# Patient Record
Sex: Female | Born: 2000 | Marital: Single | Smoking: Never smoker
Health system: Southern US, Community
[De-identification: ages and names within clinical notes are randomized; demographics above are authoritative.]

---
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
## 2020-09-17 ENCOUNTER — Other Ambulatory Visit: Payer: Self-pay | Admitting: Family Medicine

## 2020-09-17 DIAGNOSIS — S060X0A Concussion without loss of consciousness, initial encounter: Secondary | ICD-10-CM

## 2020-09-18 ENCOUNTER — Ambulatory Visit
Admission: RE | Admit: 2020-09-18 | Discharge: 2020-09-18 | Disposition: A | Discharge status: Home / Self Care | Payer: Self-pay | Source: Ambulatory Visit | Attending: Family Medicine | Admitting: Family Medicine

## 2020-09-18 DIAGNOSIS — S060X0A Concussion without loss of consciousness, initial encounter: Secondary | ICD-10-CM

## 2020-12-17 ENCOUNTER — Other Ambulatory Visit: Payer: Self-pay

## 2020-12-17 ENCOUNTER — Ambulatory Visit
Admission: EM | Admit: 2020-12-17 | Discharge: 2020-12-17 | Disposition: A | Discharge status: Home / Self Care | Payer: BC Managed Care – PPO

## 2020-12-17 ENCOUNTER — Ambulatory Visit (HOSPITAL_COMMUNITY)
Admission: EM | Admit: 2020-12-17 | Discharge: 2020-12-17 | Disposition: A | Discharge status: Home / Self Care | Payer: No Typology Code available for payment source

## 2020-12-17 DIAGNOSIS — J020 Streptococcal pharyngitis: Secondary | ICD-10-CM

## 2020-12-17 LAB — POCT RAPID STREP A (OFFICE): Rapid Strep A Screen: POSITIVE — AB

## 2020-12-17 MED ORDER — NAPROXEN 500 MG PO TABS: 500.0000 mg | ORAL_TABLET | Freq: Two times a day (BID) | ORAL | 0 refills | Status: AC

## 2020-12-17 MED ORDER — CLINDAMYCIN HCL 300 MG PO CAPS: 300.0000 mg | ORAL_CAPSULE | Freq: Three times a day (TID) | ORAL | 0 refills | Status: DC

## 2020-12-17 NOTE — ED Provider Notes (Signed)
[facility]   MRN: [id number] DOB: Nov 06, 2000  Subjective:   Joanna Camacho is a 20 y.o. female presenting for 2-day history of acute onset throat pain with painful swallowing.  Symptoms have been getting worse, currently rated 8 out of 10.  She is also started to have a rash over the chin area.  Denies fever, runny or stuffy nose, cough, chest pain, shortness of breath.  She went and got a COVID test at [facility] yesterday and was negative.  No current facility-administered medications for this encounter.  Current Outpatient Medications:    ISOtretinoin (ABSORICA) 30 MG capsule, , Disp: , Rfl:    JUNEL FE 1.5/30 1.5-30 MG-MCG tablet, Take 1 tablet by mouth daily., Disp: , Rfl:    Allergies  Allergen Reactions   Cefprozil Rash    History reviewed. No pertinent past medical history.   History reviewed. No pertinent surgical history.  History reviewed. No pertinent family history.  Social History   Tobacco Use   Smoking status: Never   Smokeless tobacco: Never    ROS   Objective:   Vitals: BP 118/78 (BP Location: Left Arm)   Pulse 81   Temp 98.5 F (36.9 C) (Oral)   Resp 18   SpO2 98%   Physical Exam Constitutional:      General: She is not in acute distress.    Appearance: Normal appearance. She is well-developed. She is not ill-appearing, toxic-appearing or diaphoretic.  HENT:     Head: Normocephalic and atraumatic.     Right Ear: External ear normal.     Left Ear: External ear normal.     Nose: Nose normal.     Mouth/Throat:     Mouth: Mucous membranes are moist.     Pharynx: Pharyngeal swelling, oropharyngeal exudate and posterior oropharyngeal erythema present. No uvula swelling.     Tonsils: Tonsillar exudate present. No tonsillar abscesses. 2+ on the right. 2+ on the left.  Eyes:     General: No scleral icterus.       Right eye: No discharge.        Left eye: No discharge.     Extraocular Movements: Extraocular movements intact.      Conjunctiva/sclera: Conjunctivae normal.     Pupils: Pupils are equal, round, and reactive to light.  Cardiovascular:     Rate and Rhythm: Normal rate.  Pulmonary:     Effort: Pulmonary effort is normal.  Skin:    General: Skin is warm and dry.     Findings: Rash (papular sand paper like rash over lower part of face) present.  Neurological:     General: No focal deficit present.     Mental Status: She is alert and oriented to person, place, and time.  Psychiatric:        Mood and Affect: Mood normal.        Behavior: Behavior normal.        Thought Content: Thought content normal.        Judgment: Judgment normal.    Results for orders placed or performed during the hospital encounter of 12/17/20 (from the past 24 hour(s))  POCT rapid strep A     Status: Abnormal   Collection Time: 12/17/20  2:10 PM  Result Value Ref Range   Rapid Strep A Screen Positive (A) Negative    Assessment and Plan :   PDMP not reviewed this encounter.  1. Strep pharyngitis     Will treat empirically for pharyngitis given physical exam  findings.  I have seen a lot of resistance to azithromycin, therefore patient is to start clindamycin given history of allergies to cefprozil and patient has never taken amoxicillin to the best of her knowledge, use supportive care otherwise. Counseled patient on potential for adverse effects with medications prescribed/recommended today, ER and return-to-clinic precautions discussed, patient verbalized understanding.    Wallis Bamberg, PA-C 12/17/20 1419

## 2020-12-17 NOTE — ED Triage Notes (Signed)
Two day h/o sore throat that has worsened since the onset and onset last night of non pruritic facial rash. No other rashes noted. Confirms dysphagia. Denies cough, congestion, abdominal pain, n/v/d.

## 2020-12-19 ENCOUNTER — Other Ambulatory Visit: Payer: Self-pay

## 2020-12-19 ENCOUNTER — Ambulatory Visit
Admission: EM | Admit: 2020-12-19 | Discharge: 2020-12-19 | Discharge status: Against Medical Advice | Payer: BC Managed Care – PPO

## 2020-12-19 ENCOUNTER — Ambulatory Visit
Admission: EM | Admit: 2020-12-19 | Discharge: 2020-12-19 | Disposition: A | Discharge status: Home / Self Care | Payer: BC Managed Care – PPO | Attending: Family Medicine | Admitting: Family Medicine

## 2020-12-19 VITALS — BP 130/86 | HR 93 | Temp 99.7°F | Resp 18

## 2020-12-19 DIAGNOSIS — T50905A Adverse effect of unspecified drugs, medicaments and biological substances, initial encounter: Secondary | ICD-10-CM | POA: Diagnosis not present

## 2020-12-19 DIAGNOSIS — J020 Streptococcal pharyngitis: Secondary | ICD-10-CM

## 2020-12-19 MED ORDER — AZITHROMYCIN 250 MG PO TABS: ORAL_TABLET | ORAL | 0 refills | Status: DC

## 2020-12-19 MED ORDER — TRIAMCINOLONE ACETONIDE 0.1 % EX CREA: 1.0000 "application " | TOPICAL_CREAM | Freq: Two times a day (BID) | CUTANEOUS | 0 refills | Status: AC

## 2020-12-19 NOTE — ED Provider Notes (Signed)
[facility]    CSN: [id number] Arrival date & time: 12/19/20  1647      History   Chief Complaint Chief Complaint  Patient presents with   appt 5 - rash    HPI Joanna Camacho is a 20 y.o. female.   Patient presenting today with diffuse itchy rash after starting clindamycin 2 days ago for strep throat.  States the rash started very shortly after beginning the medication.  Has since stopped the medication, rash has not progressed since stopping.  Not taking anything over-the-counter for symptoms thus far.  Has had antibiotic reactions in the past to cephalosporins as well that were similar.  Does state that her sore throat has improved since she started taking the antibiotics.  Denies fever, chills, throat swelling or difficulty breathing, chest tightness, wheezing, nausea or vomiting.  History reviewed. No pertinent past medical history.  There are no problems to display for this patient.   History reviewed. No pertinent surgical history.  OB History   No obstetric history on file.      Home Medications    Prior to Admission medications   Medication Sig Start Date End Date Taking? Authorizing Provider  azithromycin (ZITHROMAX Z-PAK) 250 MG tablet Take 2 tabs day one, then 1 tab daily until complete 12/19/20  Yes Particia Nearing, PA-C  triamcinolone cream (KENALOG) 0.1 % Apply 1 application topically 2 (two) times daily. 12/19/20  Yes Particia Nearing, PA-C  ISOtretinoin (ABSORICA) 30 MG capsule  10/13/14   [provider]  JUNEL FE 1.5/30 1.5-30 MG-MCG tablet Take 1 tablet by mouth daily. 12/09/20   [provider]  naproxen (NAPROSYN) 500 MG tablet Take 1 tablet (500 mg total) by mouth 2 (two) times daily with a meal. 12/17/20   Wallis Bamberg, PA-C    Family History History reviewed. No pertinent family history.  Social History Social History   Tobacco Use   Smoking status: Never   Smokeless tobacco: Never     Allergies    Cefprozil and Clindamycin/lincomycin   Review of Systems Review of Systems Per HPI  Physical Exam Triage Vital Signs ED Triage Vitals  Enc Vitals Group     BP 12/19/20 1725 130/86     Pulse Rate 12/19/20 1725 93     Resp 12/19/20 1725 18     Temp 12/19/20 1725 99.7 F (37.6 C)     Temp Source 12/19/20 1725 Oral     SpO2 12/19/20 1725 98 %     Weight --      Height --      Head Circumference --      Peak Flow --      Pain Score 12/19/20 1726 0     Pain Loc --      Pain Edu? --      Excl. in GC? --    No data found.  Updated Vital Signs BP 130/86 (BP Location: Left Arm)   Pulse 93   Temp 99.7 F (37.6 C) (Oral)   Resp 18   SpO2 98%   Visual Acuity Right Eye Distance:   Left Eye Distance:   Bilateral Distance:    Right Eye Near:   Left Eye Near:    Bilateral Near:     Physical Exam Vitals and nursing note reviewed.  Constitutional:      Appearance: Normal appearance. She is not ill-appearing.  HENT:     Head: Atraumatic.     Nose: Nose normal.  Mouth/Throat:     Mouth: Mucous membranes are moist.     Pharynx: Posterior oropharyngeal erythema present. No oropharyngeal exudate.     Comments: Bilateral tonsillar erythema and edema.  Uvula midline, oral airway patent Eyes:     Extraocular Movements: Extraocular movements intact.     Conjunctiva/sclera: Conjunctivae normal.  Neck:     Comments: Minimal cervical adenopathy bilaterally Cardiovascular:     Rate and Rhythm: Normal rate and regular rhythm.     Heart sounds: Normal heart sounds.  Pulmonary:     Effort: Pulmonary effort is normal. No respiratory distress.     Breath sounds: Normal breath sounds. No wheezing or rales.  Musculoskeletal:        General: Normal range of motion.     Cervical back: Normal range of motion and neck supple.  Lymphadenopathy:     Cervical: Cervical adenopathy present.  Skin:    General: Skin is warm and dry.  Neurological:     Mental Status: She is alert and  oriented to person, place, and time.  Psychiatric:        Mood and Affect: Mood normal.        Thought Content: Thought content normal.        Judgment: Judgment normal.     UC Treatments / Results  Labs (all labs ordered are listed, but only abnormal results are displayed) Labs Reviewed - No data to display  EKG   Radiology No results found.  Procedures Procedures (including critical care time)  Medications Ordered in UC Medications - No data to display  Initial Impression / Assessment and Plan / UC Course  I have reviewed the triage vital signs and the nursing notes.  Pertinent labs & imaging results that were available during my care of the patient were reviewed by me and considered in my medical decision making (see chart for details).     Skin rash related to medication reaction without anaphylactic signs or symptoms.  Clindamycin added to allergy list, continue DC of this medication and will send azithromycin to complete the treatment for strep throat.  Triamcinolone given for spot treatment of remainder of rash and discussed antihistamines at least twice daily until resolved.  Final Clinical Impressions(s) / UC Diagnoses   Final diagnoses:  Adverse effect of drug, initial encounter  Strep pharyngitis   Discharge Instructions   None    ED Prescriptions     Medication Sig Dispense Auth. Provider   triamcinolone cream (KENALOG) 0.1 % Apply 1 application topically 2 (two) times daily. 80 g Particia Nearing, PA-C   azithromycin (ZITHROMAX Z-PAK) 250 MG tablet Take 2 tabs day one, then 1 tab daily until complete 6 tablet Particia Nearing, [place]      PDMP not reviewed this encounter.   Particia Nearing, [place] 12/19/20 1744

## 2020-12-19 NOTE — ED Triage Notes (Signed)
Pt states started clindamycin on Monday for strep. States after 3 does she developed a rash to face, arms and legs. Denies swallowing or breathing.

## 2020-12-25 ENCOUNTER — Telehealth: Payer: Self-pay | Admitting: Emergency Medicine

## 2020-12-25 ENCOUNTER — Ambulatory Visit
Admission: EM | Admit: 2020-12-25 | Discharge: 2020-12-25 | Disposition: A | Discharge status: Home / Self Care | Payer: BC Managed Care – PPO | Attending: Emergency Medicine | Admitting: Emergency Medicine

## 2020-12-25 ENCOUNTER — Other Ambulatory Visit: Payer: Self-pay

## 2020-12-25 DIAGNOSIS — J020 Streptococcal pharyngitis: Secondary | ICD-10-CM

## 2020-12-25 LAB — POCT RAPID STREP A (OFFICE): Rapid Strep A Screen: POSITIVE — AB

## 2020-12-25 MED ORDER — PREDNISONE 20 MG PO TABS: 40.0000 mg | ORAL_TABLET | Freq: Every day | ORAL | 0 refills | Status: AC

## 2020-12-25 MED ORDER — METHYLPREDNISOLONE SODIUM SUCC 125 MG IJ SOLR
60.0000 mg | Freq: Once | INTRAMUSCULAR | Status: AC
  Administered 2020-12-25: 60 mg via INTRAMUSCULAR

## 2020-12-25 MED ORDER — AMOXICILLIN 500 MG PO CAPS: 500.0000 mg | ORAL_CAPSULE | Freq: Two times a day (BID) | ORAL | 0 refills | Status: AC

## 2020-12-25 NOTE — Discharge Instructions (Addendum)
Your rapid strep test today was positive  Take antibiotic twice a day for 10 days, if rash occurs or trouble breathing, shortness of breath, wheezing, stop immediately and notify Urgent care   Start prednisone in  48 hours if swelling has not started to decrease  take 2 prednisone pills with food in the morning for the next 5 days, this medication will help with swelling and ideally will provide you comfort  While using prednisone please do not take any ibuprofen, Aleve or similar medications, if needing additional comfort can use Tylenol 650 mg every 6 hours as needed for pain management  Benadryl 50 mg if rash occurs, can be used every 6 hours needed

## 2020-12-25 NOTE — Telephone Encounter (Signed)
Patient called regarding her medication.  Patient was diagnosed with strep throat and given Clindamycin for 10 days.  After day 2, patient developed a rash.  Patient stopped medication, was seen and given Azithromycin #6.  Patient has completed medication and is no better.  Patient states that original prescription was for 10 days then switched to 5 days.  Patient's throat is still swollen and can barely swallow.  Please advise.  [facility].

## 2020-12-25 NOTE — ED Triage Notes (Signed)
Pt c/o sore throat over a week ago. States has been seen here twice for same. States now having swelling to rt side of throat. No distress noted. Denies  difficulty breathing.

## 2020-12-25 NOTE — Telephone Encounter (Signed)
Patient informed of [facility] recommendation of either following up in the ER or here in urgent care for re-evaluation due to her complaint of swollen throat.  Patient voices understanding.

## 2020-12-27 NOTE — ED Provider Notes (Signed)
[facility]    CSN: [id number] Arrival date & time: 12/25/20  1459      History   Chief Complaint Chief Complaint  Patient presents with   Sore Throat    HPI Joanna Camacho is a 20 y.o. female.   Patient presents with sore throat for 13 days, painful to swallow and throat feels swollen on right side. Has been attempting to eat but difficult due to pain, tolerating liquids. Denies difficulty swallowing, shortness of breath, chest pain or tightness, dental pain, facial swelling. Seen in UC twice, positive for strep, completed azithromycin yesterday.  Initially started on clindamycin, allergic reaction occurred. No improvement has been seen with medications.   History reviewed. No pertinent past medical history.  There are no problems to display for this patient.   History reviewed. No pertinent surgical history.  OB History   No obstetric history on file.      Home Medications    Prior to Admission medications   Medication Sig Start Date End Date Taking? Authorizing Provider  amoxicillin (AMOXIL) 500 MG capsule Take 1 capsule (500 mg total) by mouth 2 (two) times daily. 12/25/20  Yes Shontavia Mickel R, NP  predniSONE (DELTASONE) 20 MG tablet Take 2 tablets (40 mg total) by mouth daily. 12/25/20  Yes Valinda Hoar, NP  ISOtretinoin (ABSORICA) 30 MG capsule  10/13/14   [provider]  JUNEL FE 1.5/30 1.5-30 MG-MCG tablet Take 1 tablet by mouth daily. 12/09/20   [provider]  naproxen (NAPROSYN) 500 MG tablet Take 1 tablet (500 mg total) by mouth 2 (two) times daily with a meal. 12/17/20   Wallis Bamberg, PA-C  triamcinolone cream (KENALOG) 0.1 % Apply 1 application topically 2 (two) times daily. 12/19/20   Particia Nearing, PA-C    Family History History reviewed. No pertinent family history.  Social History Social History   Tobacco Use   Smoking status: Never   Smokeless tobacco: Never  Substance Use Topics   Alcohol use: Not  Currently   Drug use: Not Currently     Allergies   Cefprozil and Clindamycin/lincomycin   Review of Systems Review of Systems Defer to HPI    Physical Exam Triage Vital Signs ED Triage Vitals [12/25/20 1534]  Enc Vitals Group     BP 110/64     Pulse Rate (!) 104     Resp 18     Temp 99.4 F (37.4 C)     Temp Source Oral     SpO2 97 %     Weight      Height      Head Circumference      Peak Flow      Pain Score 9     Pain Loc      Pain Edu?      Excl. in GC?    No data found.  Updated Vital Signs BP 110/64 (BP Location: Left Arm)   Pulse (!) 104   Temp 99.4 F (37.4 C) (Oral)   Resp 18   LMP 12/03/2020   SpO2 97%   Visual Acuity Right Eye Distance:   Left Eye Distance:   Bilateral Distance:    Right Eye Near:   Left Eye Near:    Bilateral Near:     Physical Exam Constitutional:      Appearance: She is well-developed and normal weight.  HENT:     Head: Normocephalic.     Right Ear: Tympanic membrane and ear canal normal.  Left Ear: Tympanic membrane and ear canal normal.     Nose: No congestion or rhinorrhea.     Mouth/Throat:     Mouth: Mucous membranes are moist.     Pharynx: Uvula midline. Posterior oropharyngeal erythema present.     Tonsils: Tonsillar exudate present. No tonsillar abscesses. 3+ on the right. 3+ on the left.  Eyes:     Pupils: Pupils are equal, round, and reactive to light.  Cardiovascular:     Rate and Rhythm: Normal rate and regular rhythm.     Heart sounds: Normal heart sounds.  Pulmonary:     Effort: Pulmonary effort is normal.  Musculoskeletal:     Cervical back: Normal range of motion.  Lymphadenopathy:     Cervical: Cervical adenopathy present.  Skin:    General: Skin is warm and dry.  Neurological:     General: No focal deficit present.     Mental Status: She is alert and oriented to person, place, and time.  Psychiatric:        Mood and Affect: Mood normal.        Behavior: Behavior normal.     UC  Treatments / Results  Labs (all labs ordered are listed, but only abnormal results are displayed) Labs Reviewed  POCT RAPID STREP A (OFFICE) - Abnormal; Notable for the following components:      Result Value   Rapid Strep A Screen Positive (*)    All other components within normal limits  CULTURE, GROUP A STREP [facility])    EKG   Radiology No results found.  Procedures Procedures (including critical care time)  Medications Ordered in UC Medications  methylPREDNISolone sodium succinate (SOLU-MEDROL) 125 mg/2 mL injection 60 mg (60 mg Intramuscular Given 12/25/20 1600)    Initial Impression / Assessment and Plan / UC Course  I have reviewed the triage vital signs and the nursing notes.  Pertinent labs & imaging results that were available during my care of the patient were reviewed by me and considered in my medical decision making (see chart for details).  Clinical Course as of 12/27/20 0818  Tue Dec 25, 2020  1609 POCT rapid strep A(!) [AW]    Clinical Course User Index [AW] Valinda Hoar, NP    Strep Pharyngitis  Rapid strep positive, sent to culture Amoxicillin 500 mg bid for 10 days, has mild allergy to Cephalosporin, discussed potential for adverse reaction with patient and parent, mother attest to tolerance of amoxicillin as child. Will move forward with use with monitoring, instructed to stop medication immediately for any reaction, take dose of benadryl  if rash occurs, seek treatment for respiratory involvement and to notify Urgent care of occurrence   Methylprednisolone 60 mg IM now Prednisone 40 mg daily for 5 days to be started if swelling has not decreased after 48 hours on antibiotic use Strict return precaution if symptoms still present or worsening at completion of medications   MfFinal Clinical Impressions(s) / UC Diagnoses   Final diagnoses:  Strep pharyngitis     Discharge Instructions      Your rapid strep test today was positive  Take  antibiotic twice a day for 10 days, if rash occurs or trouble breathing, shortness of breath, wheezing, stop immediately and notify Urgent care   Start prednisone in  48 hours if swelling has not started to decrease  take 2 prednisone pills with food in the morning for the next 5 days, this medication will help with swelling and ideally  will provide you comfort  While using prednisone please do not take any ibuprofen, Aleve or similar medications, if needing additional comfort can use Tylenol 650 mg every 6 hours as needed for pain management  Benadryl 50 mg if rash occurs, can be used every 6 hours needed   ED Prescriptions     Medication Sig Dispense Auth. Provider   predniSONE (DELTASONE) 20 MG tablet Take 2 tablets (40 mg total) by mouth daily. 10 tablet Jalyne Brodzinski, Hansel Starling R, NP   amoxicillin (AMOXIL) 500 MG capsule Take 1 capsule (500 mg total) by mouth 2 (two) times daily. 20 capsule Valinda Hoar, NP      PDMP not reviewed this encounter.   Valinda Hoar, [place] 12/27/20 [phone]

## 2022-08-16 IMAGING — CT CT HEAD W/O CM
4 series · 16 of 47 positions shown, 18 images · non-contrast
Comparison: No pertinent prior exams available for comparison.

CLINICAL DATA: Concussion without loss of consciousness, initial
encounter. Additional history provided by scanning technologist:
Patient reports headaches, concussion without loss of consciousness
(with vomiting), hit in head with softball and kneed in head 2-3
weeks ago.

EXAM:
CT HEAD WITHOUT CONTRAST
TECHNIQUE: Contiguous axial images were obtained from the base of the skull
through the vertex without intravenous contrast.

[Series 2: head 5.00 hr40 s3 axial ibhc · axial · 0.42mm/px · z∈[-622,-512]mm · 6 of 32 slices shown, 8 images]
[im 5/32  brain]
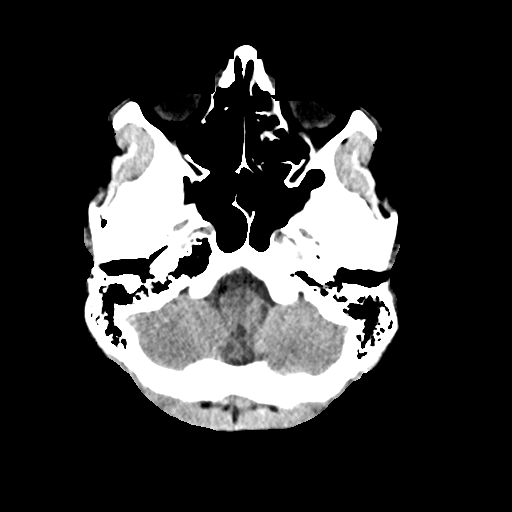
[im 5/32  bone]
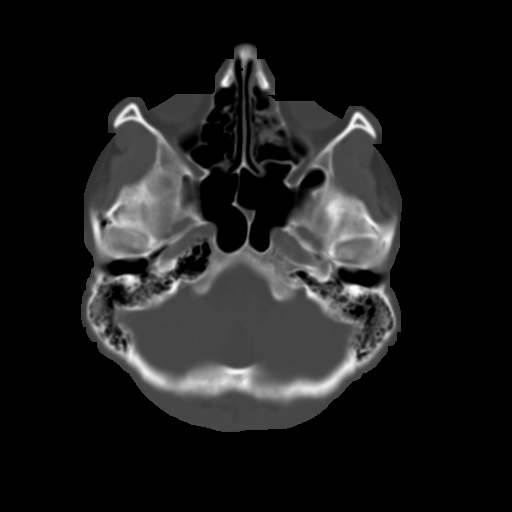
[im 9/32  brain]
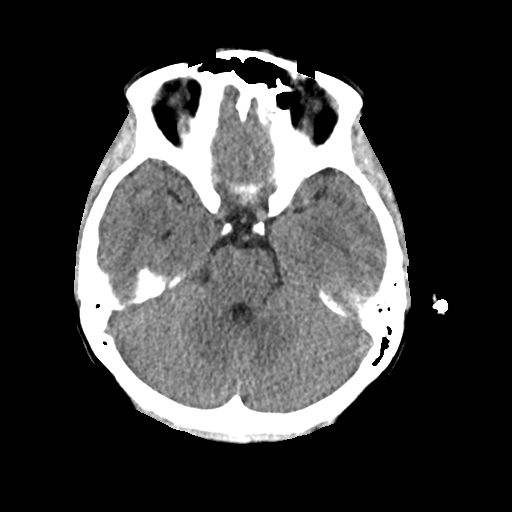
[im 14/32  brain]
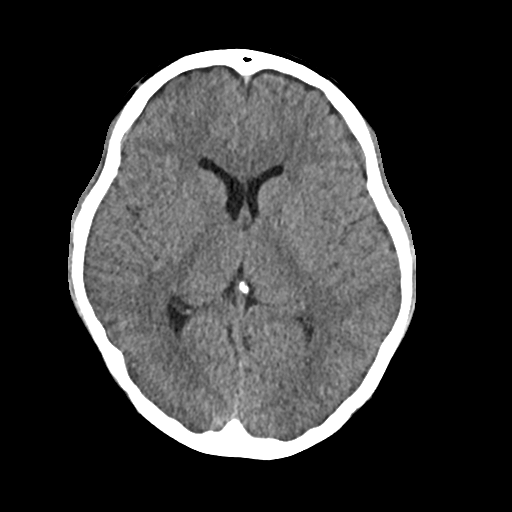
[im 18/32  brain]
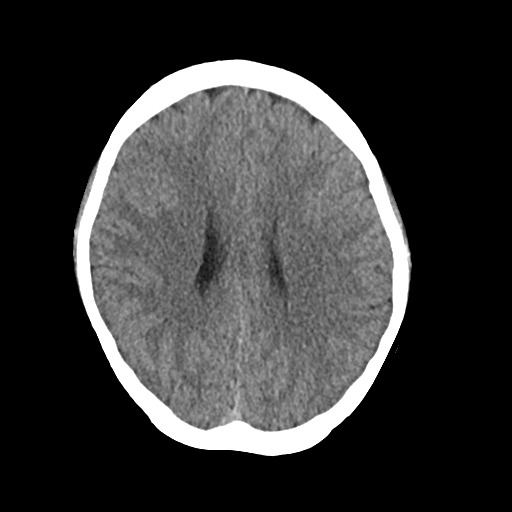
[im 23/32  brain]
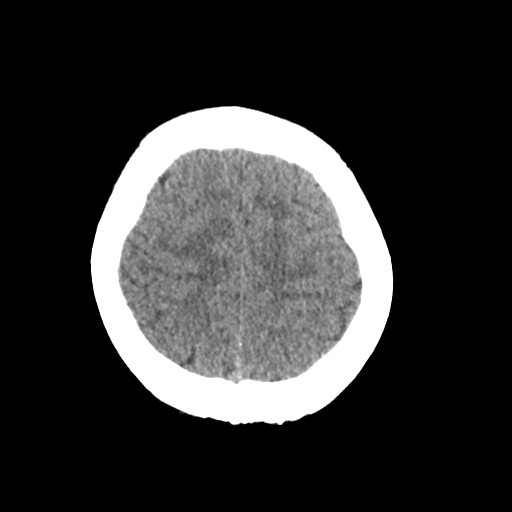
[im 23/32  bone]
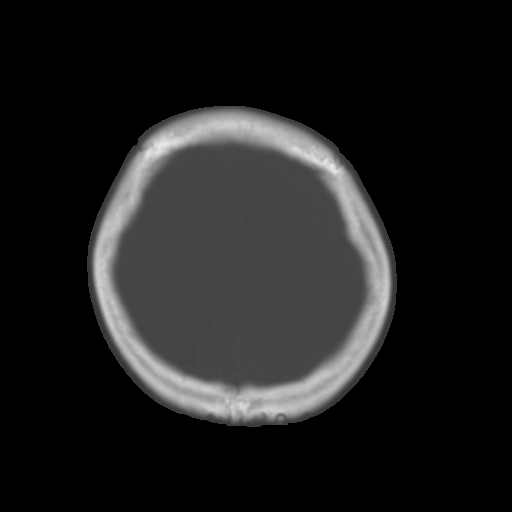
[im 27/32  brain]
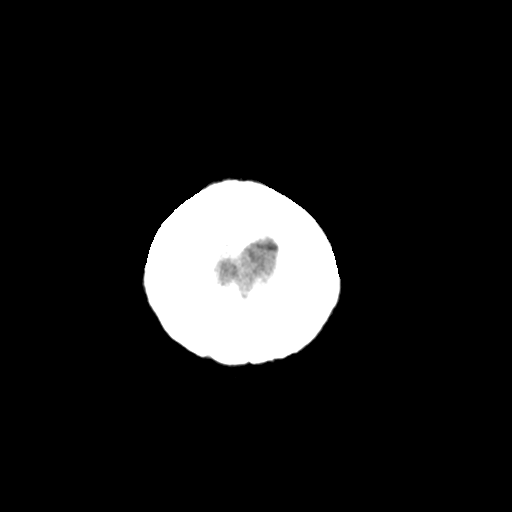

[Series 3: head 2.00 hr60 s3 axial bone · axial · 0.42mm/px · z∈[-629,-575]mm · 4 of 81 slices shown]
[im 8/81  bone]
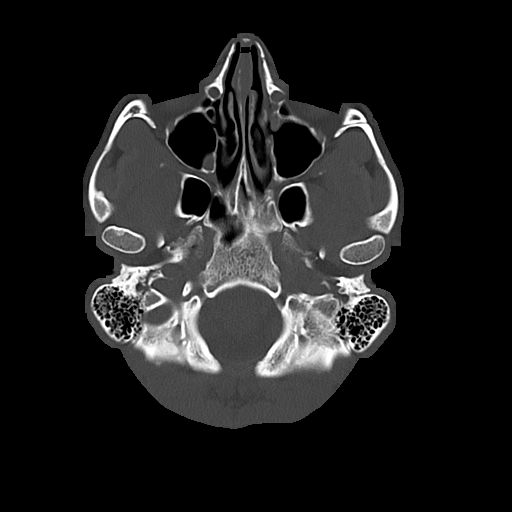
[im 16/81  bone]
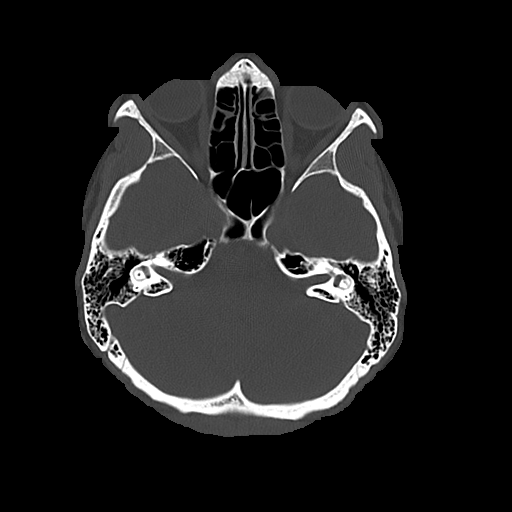
[im 27/81  bone]
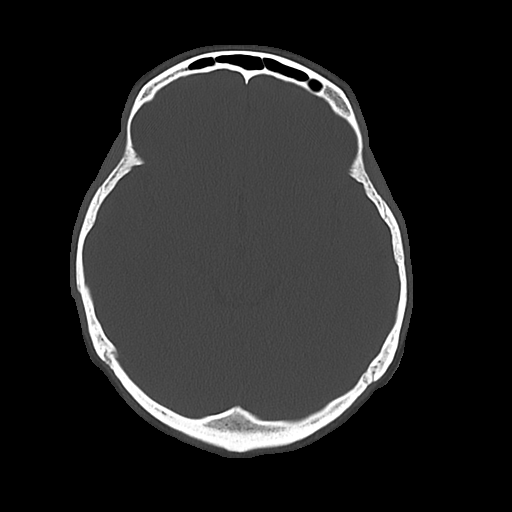
[im 35/81  bone]
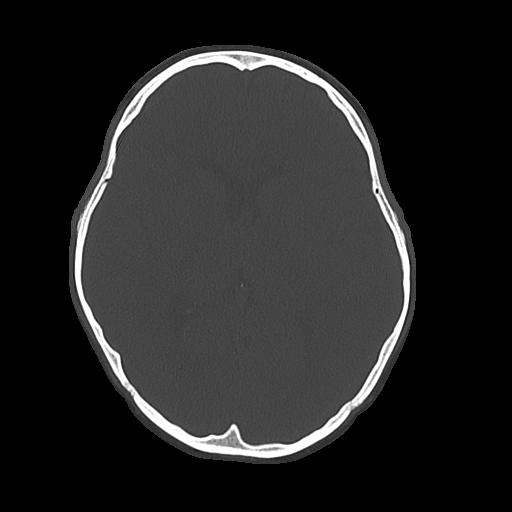

[Series 4: head 3.00 hr40 s3 sag · sagittal · 0.32mm/px · 3 of 70 slices shown]
[im 24/70  brain]
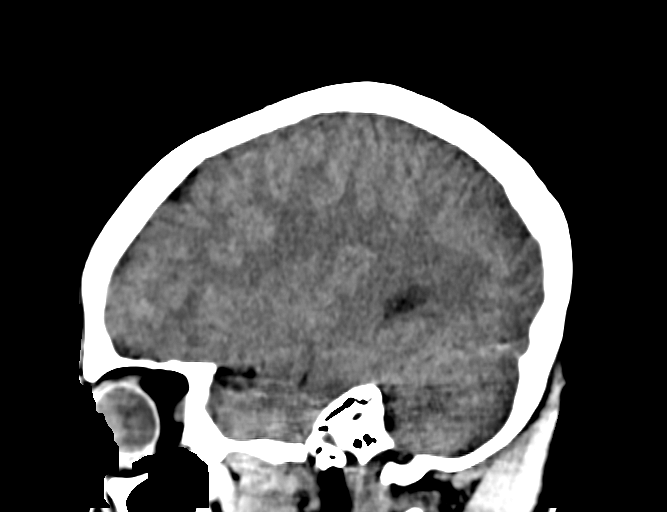
[im 35/70  brain]
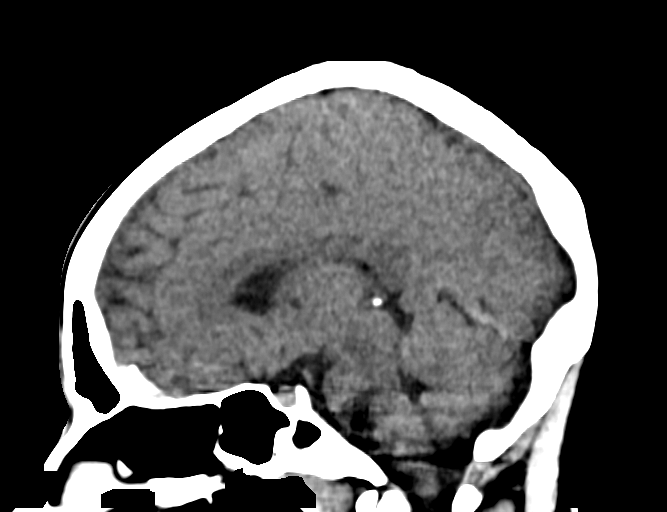
[im 47/70  brain]
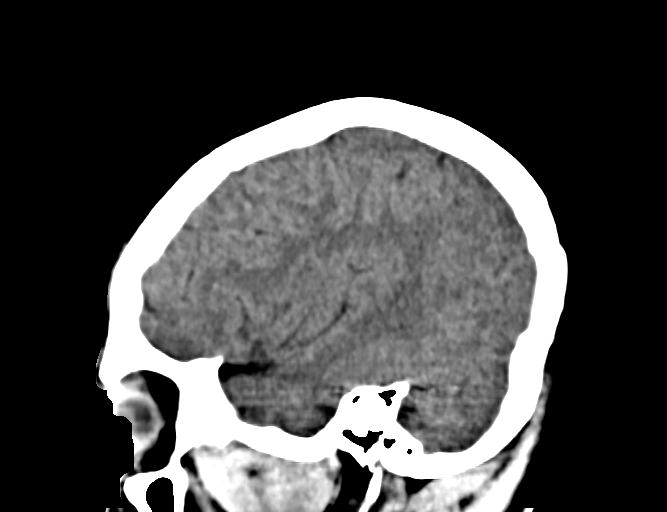

[Series 6: head 3.00 hr40 s3 cor · coronal · 0.32mm/px · 3 of 70 slices shown]
[im 24/70  brain]
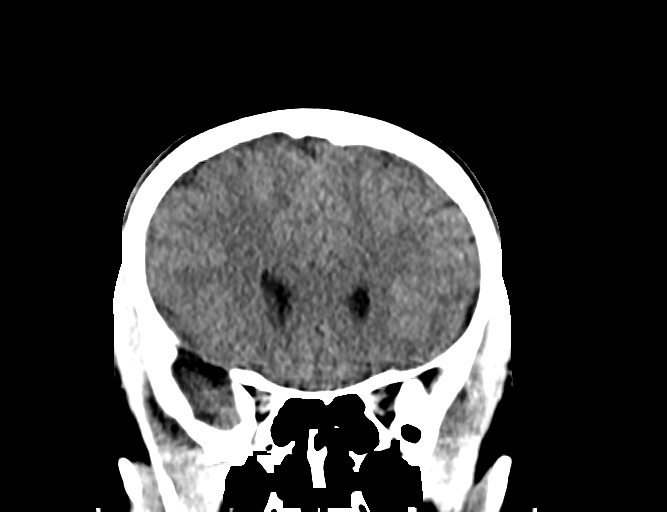
[im 31/70  brain]
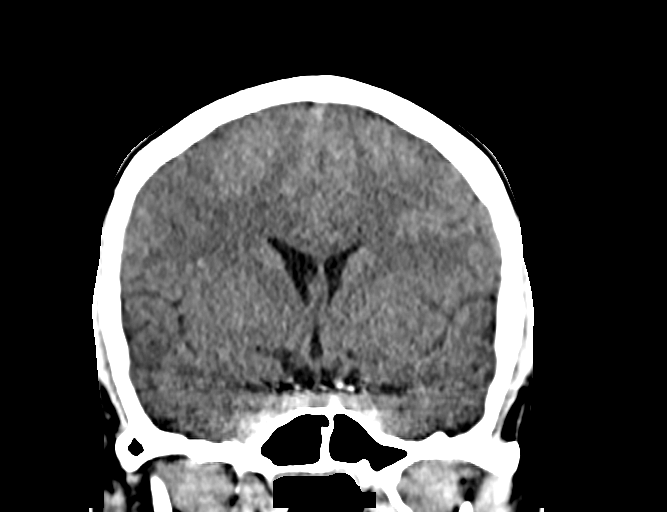
[im 39/70  brain]
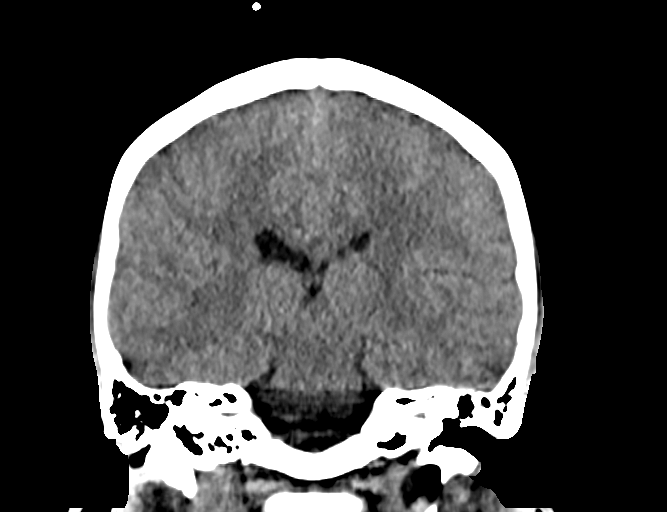

[16 of 47 positions shown; findings below may reference images not displayed]

FINDINGS: Brain:

Cerebral volume is normal.

There is no acute intracranial hemorrhage.

No demarcated cortical infarct.

No extra-axial fluid collection.

No evidence of intracranial mass.

No midline shift.

Vascular: No hyperdense vessel.

Skull: Normal. Negative for fracture or focal lesion.

Sinuses/Orbits: Visualized orbits show no acute finding. Mild
scattered mucosal thickening within the right ethmoid air cells.
Mild-to-moderate scattered mucosal thickening within the left
ethmoid air cells. Small volume frothy secretions layering within
the left sphenoid sinus.
IMPRESSION: Unremarkable non-contrast CT appearance of the brain. No evidence of
acute intracranial abnormality.

Paranasal sinus disease at the imaged levels, as described.
